# Patient Record
Sex: Female | Born: 2000 | Race: White | Hispanic: No | Marital: Single | Smoking: Never smoker
Health system: Southern US, Community
[De-identification: ages and names within clinical notes are randomized; demographics above are authoritative.]

## PROBLEM LIST (undated history)

## (undated) HISTORY — PX: TONSILLECTOMY: SUR1361

## (undated) HISTORY — PX: ADENOIDECTOMY: SUR15

---
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
## 2020-11-03 IMAGING — CR DG ABDOMEN 2V
2 series · 2 of 2 positions shown · non-contrast
Comparison: CT abdomen and pelvis 12/01/2018

CLINICAL DATA: Colonoscopy today. Rectal bleeding and low abdominal
pain with nausea since then.

EXAM:
ABDOMEN - 2 VIEW

[w abdomen upright]
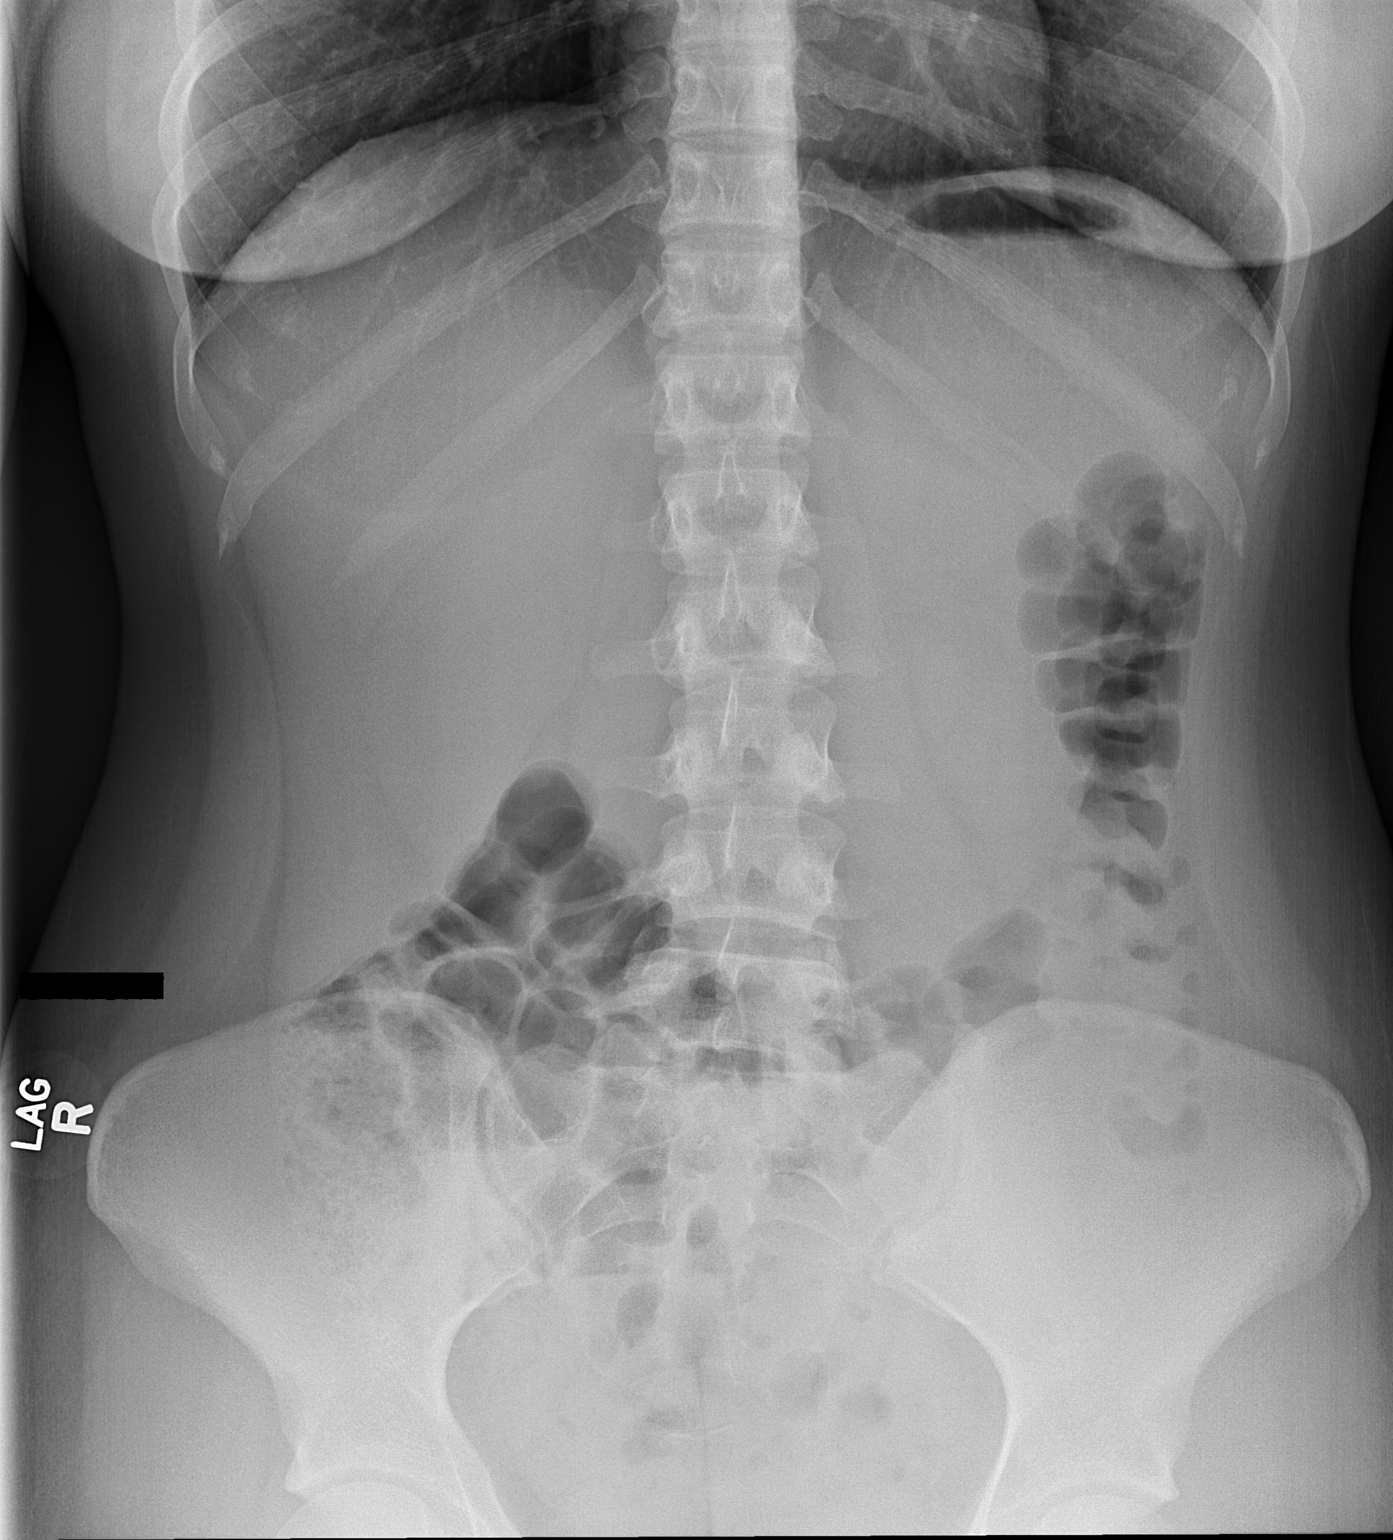

[t abdomen supine]
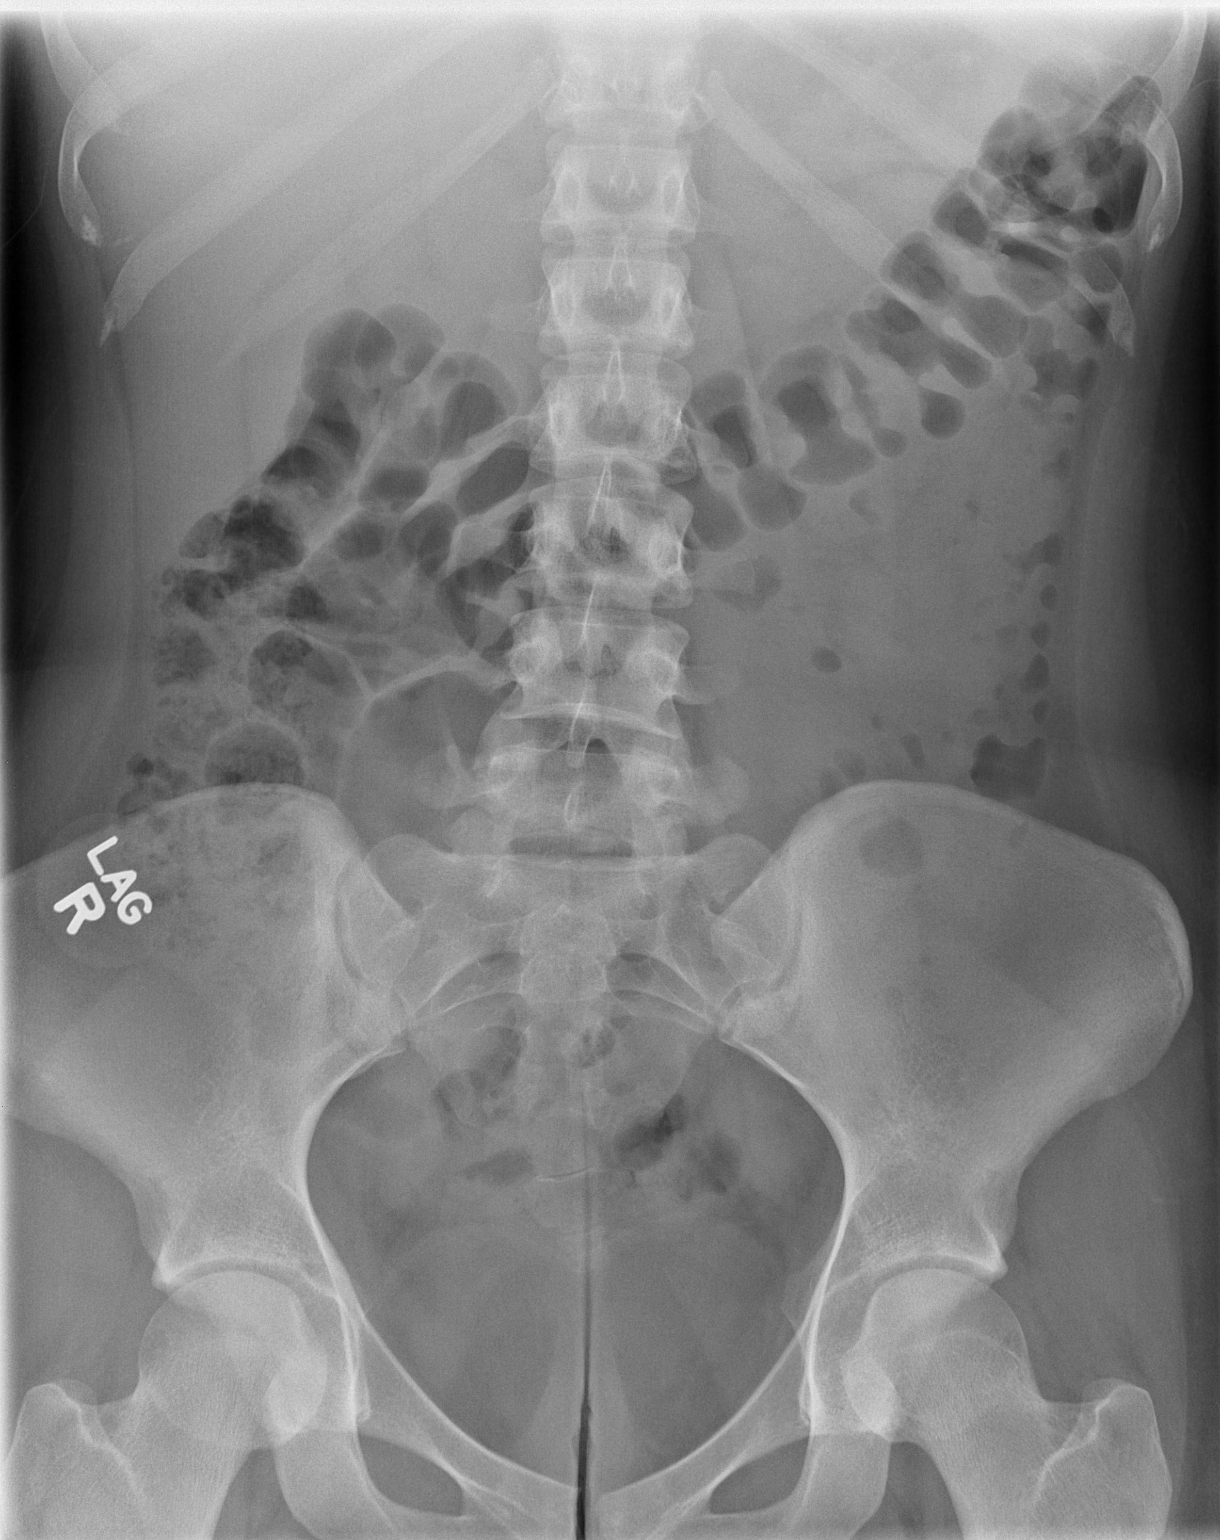

[2 of 2 positions shown; findings below may reference images not displayed]

FINDINGS: Scattered gas and stool throughout the colon. No small or large
bowel distention. No free intra-abdominal air. No abnormal air-fluid
levels. No radiopaque stones. Visualized bones and soft tissue
structures appear intact.
IMPRESSION: Nonobstructive bowel gas pattern.
